# Patient Record
Sex: Female | Born: 1970 | Race: White | Hispanic: No | Marital: Single | Smoking: Never smoker
Health system: Southern US, Community
[De-identification: ages and names within clinical notes are randomized; demographics above are authoritative.]

## PROBLEM LIST (undated history)

## (undated) DIAGNOSIS — M3500 Sicca syndrome, unspecified: Secondary | ICD-10-CM

## (undated) HISTORY — PX: TONSILLECTOMY: SUR1361

## (undated) HISTORY — PX: MYRINGOTOMY: SUR874

---

## 2009-09-29 ENCOUNTER — Ambulatory Visit: Payer: Self-pay | Admitting: Internal Medicine

## 2011-06-19 ENCOUNTER — Ambulatory Visit: Payer: Self-pay

## 2011-08-20 ENCOUNTER — Ambulatory Visit: Payer: Self-pay

## 2012-05-15 ENCOUNTER — Ambulatory Visit: Payer: Self-pay

## 2013-05-22 ENCOUNTER — Ambulatory Visit: Payer: Self-pay | Admitting: Emergency Medicine

## 2013-05-22 LAB — CBC WITH DIFFERENTIAL/PLATELET
Basophil #: 0 10*3/uL (ref 0.0–0.1)
Basophil %: 0.3 %
Eosinophil #: 0 10*3/uL (ref 0.0–0.7)
Eosinophil %: 1 %
HCT: 43.2 % (ref 35.0–47.0)
HGB: 14.6 g/dL (ref 12.0–16.0)
Lymphocyte #: 0.8 10*3/uL — ABNORMAL LOW (ref 1.0–3.6)
Lymphocyte %: 16.6 %
MCH: 29.3 pg (ref 26.0–34.0)
MCHC: 33.7 g/dL (ref 32.0–36.0)
MCV: 87 fL (ref 80–100)
Monocyte #: 0.4 x10 3/mm (ref 0.2–0.9)
Monocyte %: 7.6 %
Neutrophil #: 3.6 10*3/uL (ref 1.4–6.5)
Neutrophil %: 74.5 %
Platelet: 156 10*3/uL (ref 150–440)
RBC: 4.98 10*6/uL (ref 3.80–5.20)
RDW: 13.2 % (ref 11.5–14.5)
WBC: 4.8 10*3/uL (ref 3.6–11.0)

## 2013-05-22 LAB — COMPREHENSIVE METABOLIC PANEL
Albumin: 4.2 g/dL (ref 3.4–5.0)
Alkaline Phosphatase: 57 U/L (ref 50–136)
Anion Gap: 7 (ref 7–16)
BUN: 13 mg/dL (ref 7–18)
Bilirubin,Total: 0.5 mg/dL (ref 0.2–1.0)
Calcium, Total: 9 mg/dL (ref 8.5–10.1)
Chloride: 104 mmol/L (ref 98–107)
Co2: 27 mmol/L (ref 21–32)
Creatinine: 0.73 mg/dL (ref 0.60–1.30)
EGFR (African American): >60
EGFR (Non-African Amer.): >60
Glucose: 98 mg/dL (ref 65–99)
Osmolality: 276 (ref 275–301)
Potassium: 4.4 mmol/L (ref 3.5–5.1)
SGOT(AST): 17 U/L (ref 15–37)
SGPT (ALT): 22 U/L (ref 12–78)
Sodium: 138 mmol/L (ref 136–145)
Total Protein: 8.1 g/dL (ref 6.4–8.2)

## 2013-05-22 LAB — SEDIMENTATION RATE: Erythrocyte Sed Rate: 14 mm/hr (ref 0–20)

## 2013-11-08 IMAGING — CR DG CHEST 2V
1 series · 2 of 2 positions shown · non-contrast
Comparison: none

REASON FOR EXAM: Wheezing; Hx of Sjogrens Syndrome
COMMENTS:   LMP: Now

[Series 1: pa · 0.17mm/px · 2 of 2 slices shown]
[im 1/2]
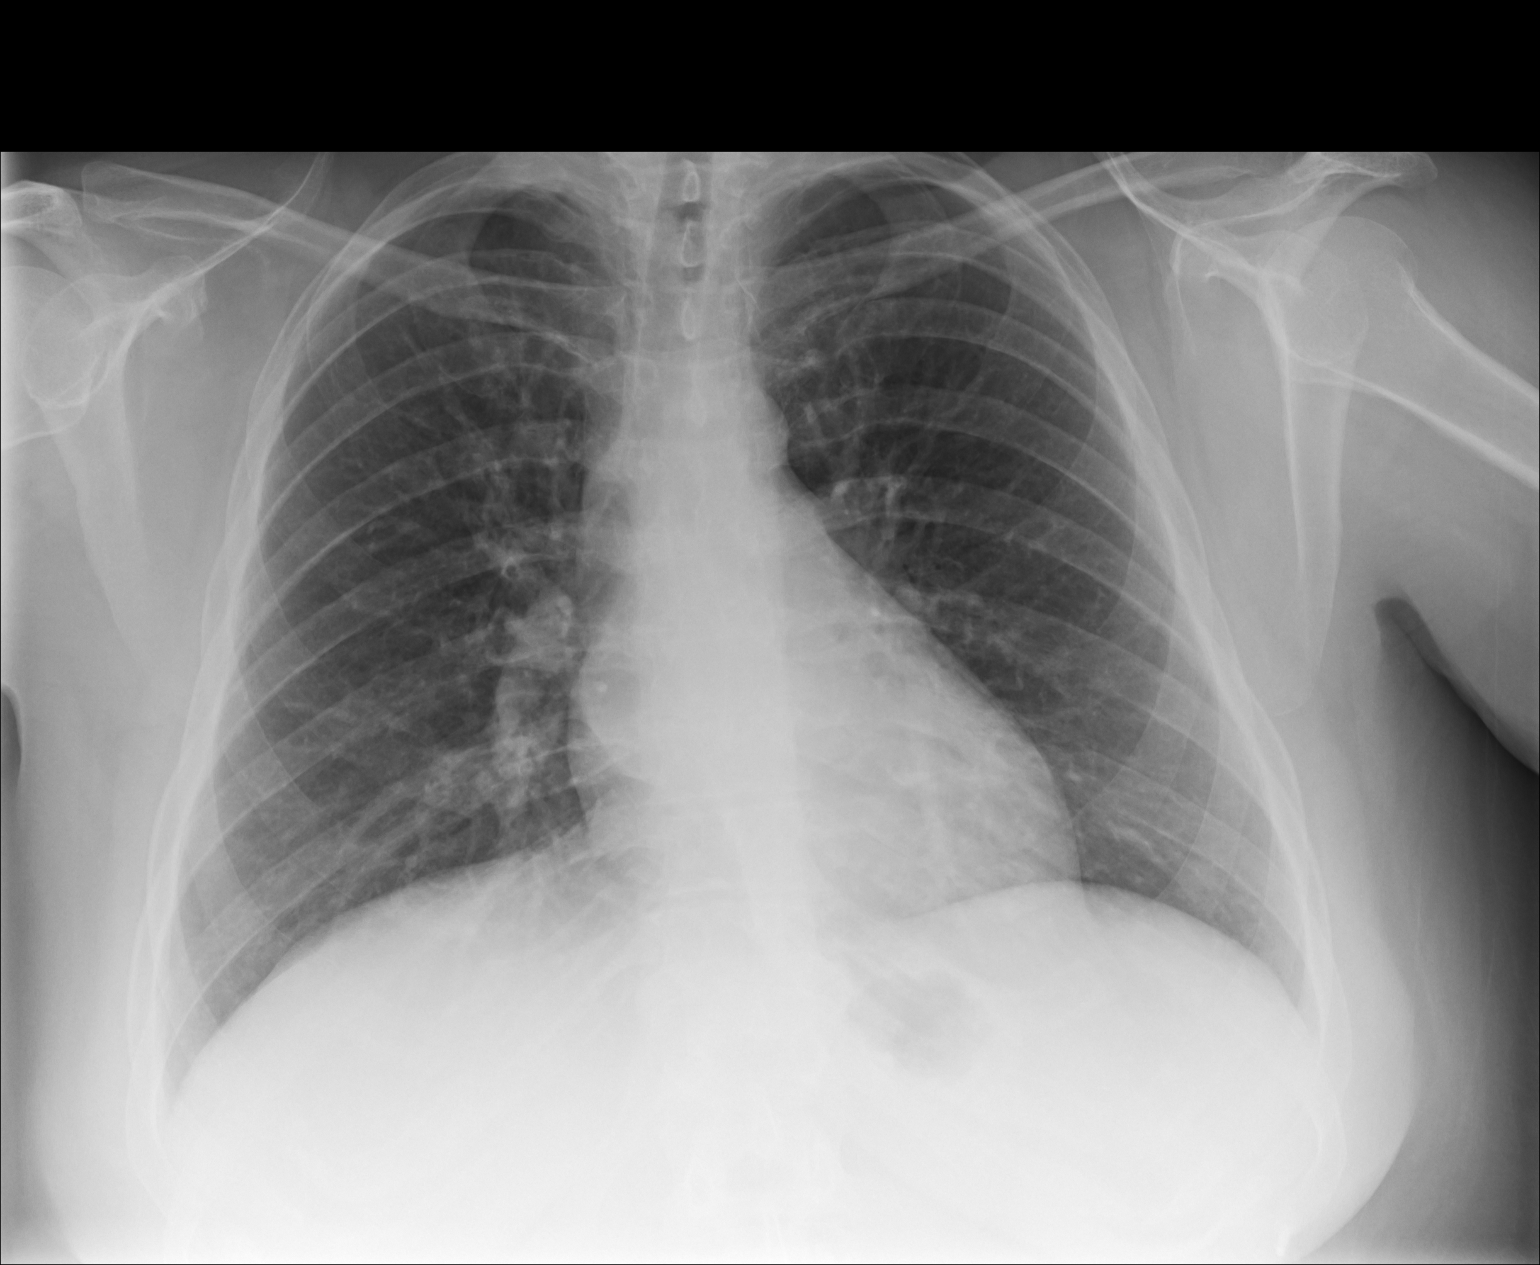
[im 2/2]
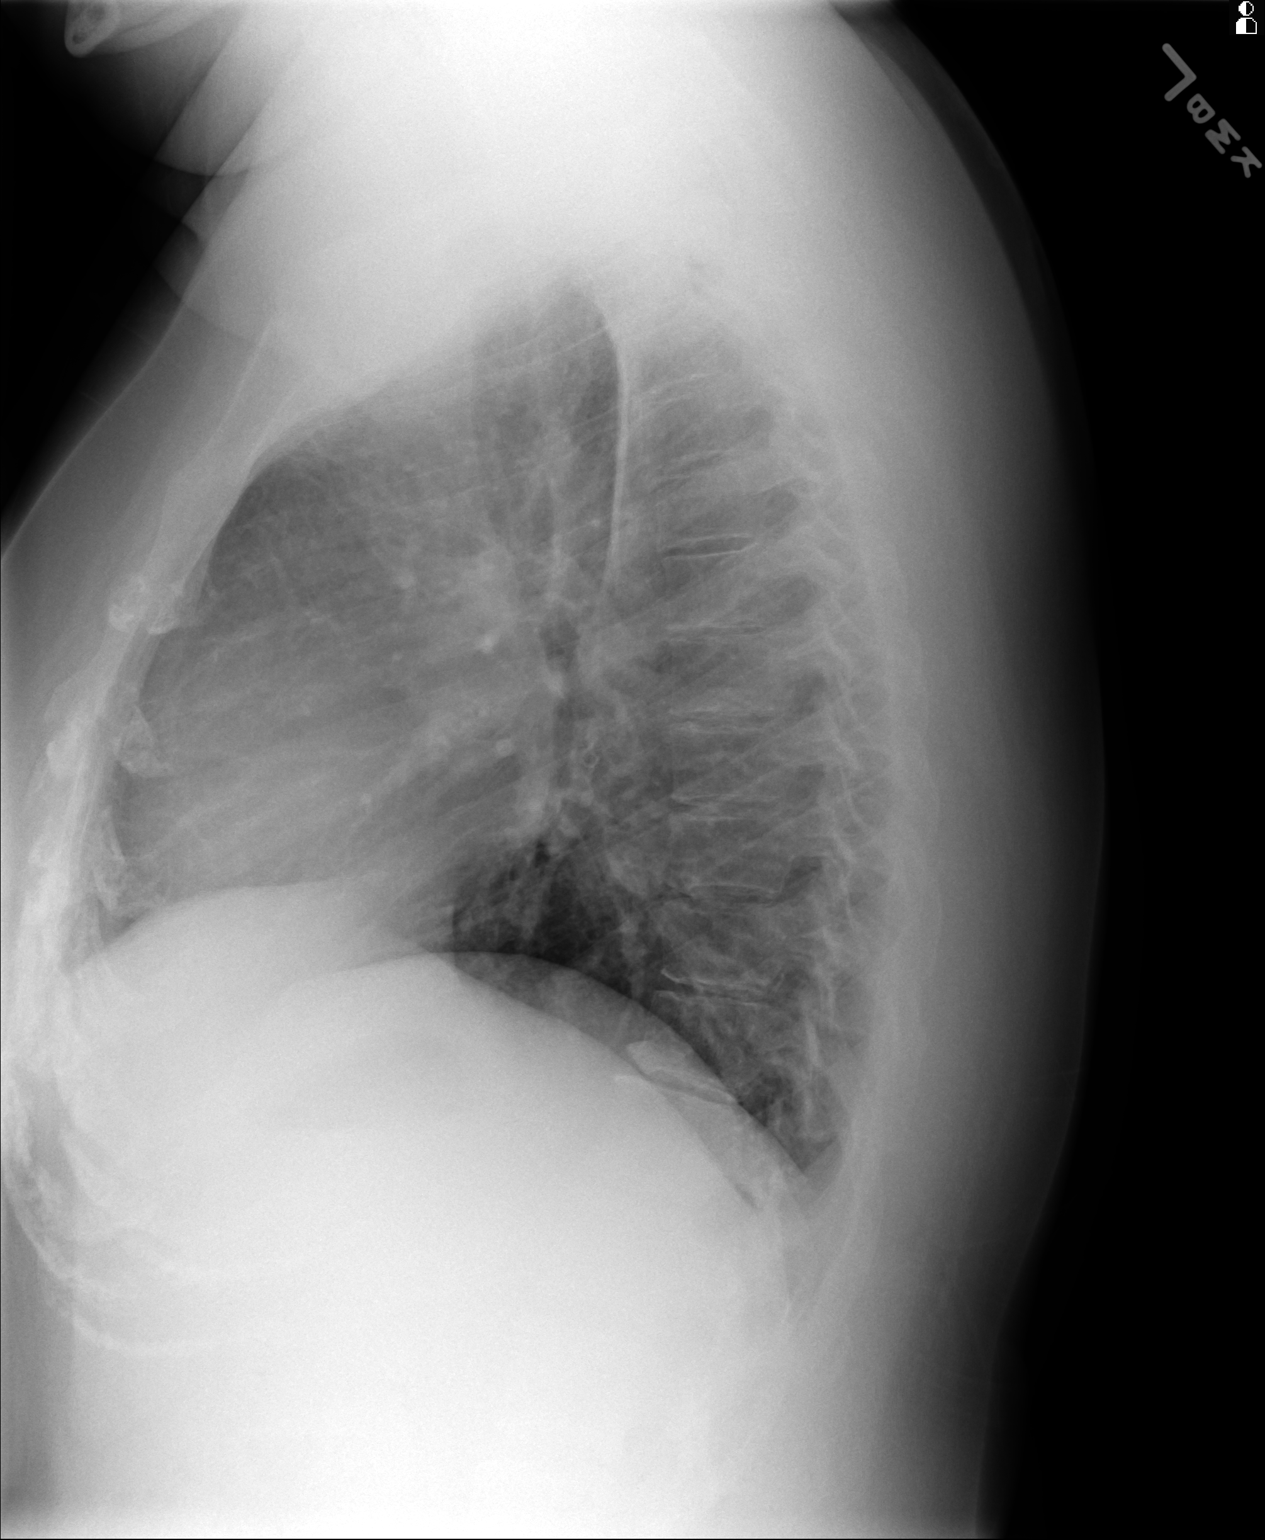

[2 of 2 positions shown; findings below may reference images not displayed]

PROCEDURE:     MDR - MDR CHEST PA(OR AP) AND LATERAL  - May 15, 2012 [DATE]

RESULT:     Comparison is made to the study 19 June, 2011.

The lungs are clear. The heart and pulmonary vessels are normal. The bony
and mediastinal structures are unremarkable. There is no effusion. There is
no pneumothorax or evidence of congestive failure.
IMPRESSION: No acute cardiopulmonary disease.

[REDACTED]

## 2015-05-21 ENCOUNTER — Ambulatory Visit
Admission: EM | Admit: 2015-05-21 | Discharge: 2015-05-21 | Disposition: A | Discharge status: Home / Self Care | Payer: 59 | Attending: Family Medicine | Admitting: Family Medicine

## 2015-05-21 DIAGNOSIS — L237 Allergic contact dermatitis due to plants, except food: Secondary | ICD-10-CM

## 2015-05-21 HISTORY — DX: Sjogren syndrome, unspecified: M35.00

## 2015-05-21 MED ORDER — PREDNISONE 10 MG (21) PO TBPK: 10.0000 mg | ORAL_TABLET | Freq: Every day | ORAL | Status: AC

## 2015-05-21 MED ORDER — TRIAMCINOLONE ACETONIDE 0.1 % EX CREA: 1.0000 "application " | TOPICAL_CREAM | Freq: Two times a day (BID) | CUTANEOUS | Status: AC

## 2015-05-21 NOTE — Discharge Instructions (Signed)
Poison Oak Poison oak is an inflammation of the skin (contact dermatitis). It is caused by contact with the allergens on the leaves of the oak (toxicodendron) plants. Depending on your sensitivity, the rash may consist simply of redness and itching, or it may also progress to blisters which may break open (rupture). These must be well cared for to prevent secondary germ (bacterial) infection as these infections can lead to scarring. The eyes may also get puffy. The puffiness is worst in the morning and gets better as the day progresses. Healing is best accomplished by keeping any open areas dry, clean, covered with a bandage, and covered with an antibacterial ointment if needed. Without secondary infection, this dermatitis usually heals without scarring within 2 to 3 weeks without treatment. HOME CARE INSTRUCTIONS When you have been exposed to poison oak, it is very important to thoroughly wash with soap and water as soon as the exposure has been discovered. You have about one half hour to remove the plant resin before it will cause the rash. This cleaning will quickly destroy the oil or antigen on the skin (the antigen is what causes the rash). Wash aggressively under the fingernails as any plant resin still there will continue to spread the rash. Do not rub skin vigorously when washing affected area. Poison oak cannot spread if no oil from the plant remains on your body. Rash that has progressed to weeping sores (lesions) will not spread the rash unless you have not washed thoroughly. It is also important to clean any clothes you have been wearing as they may carry active allergens which will spread the rash, even several days later. Avoidance of the plant in the future is the best measure. Poison oak plants can be recognized by the number of leaves. Generally, poison oak has three leaves with flowering branches on a single stem. Diphenhydramine may be purchased over the counter and used as needed for  itching. Do not drive with this medication if it makes you drowsy. Ask your caregiver about medication for children. SEEK IMMEDIATE MEDICAL CARE IF:   Open areas of the rash develop.  You notice redness extending beyond the area of the rash.  There is a pus like discharge.  There is increased pain.  Other signs of infection develop (such as fever). Document Released: 02/20/2003 Document Revised: 11/08/2011 Document Reviewed: 07/02/2009 [employer] Patient Information 2015 [employer]. This information is not intended to replace advice given to you by your health care provider. Make sure you discuss any questions you have with your health care provider.  

## 2015-05-21 NOTE — ED Notes (Signed)
Pt states "I have poison oak for a week or so, I thought I had it under control and then more is around my neck."

## 2015-05-21 NOTE — ED Provider Notes (Signed)
CSN: [id number]     Arrival date & time 05/21/15  1243 History   First MD Initiated Contact with Patient 05/21/15 1423     Chief Complaint  Patient presents with  . Rash   (Consider location/radiation/quality/duration/timing/severity/associated sxs/prior Treatment) HPI   This a 44 year old female who presents with one-week history of poison ivy she's been treating over-the-counter medications but to no avail. It actually started to spread further. His one week ago she was painting the backside of her fence along a blood line must of contacted the plan at that time. The rash is very itchy. Started on her neck and that appears where it seems to be the most severe.  Past Medical History  Diagnosis Date  . Sjoegren syndrome    Past Surgical History  Procedure Laterality Date  . Tonsillectomy    . Myringotomy     Family History  Problem Relation Age of Onset  . Diabetes Mother   . Diabetes Father   . Coronary artery disease Father    Social History  Substance Use Topics  . Smoking status: Never Smoker   . Smokeless tobacco: None  . Alcohol Use: Yes   OB History    No data available     Review of Systems  Constitutional: Negative for fever, chills and fatigue.  Skin: Positive for color change and rash.  All other systems reviewed and are negative.   Allergies  Review of patient's allergies indicates no known allergies.  Home Medications   Prior to Admission medications   Medication Sig Start Date End Date Taking? Authorizing Provider  predniSONE (STERAPRED UNI-PAK 21 TAB) 10 MG (21) TBPK tablet Take 1 tablet (10 mg total) by mouth daily. Take 6 tabs by mouth daily  for 2 days, then 5 tabs for 2 days, then 4 tabs for 2 days, then 3 tabs for 2 days, 2 tabs for 2 days, then 1 tab by mouth daily for 2 days 05/21/15   Lutricia Feil, PA-C  triamcinolone cream (KENALOG) 0.1 % Apply 1 application topically 2 (two) times daily. 05/21/15   Lutricia Feil, PA-C   Meds Ordered  and Administered this Visit  Medications - No data to display  BP 131/88 mmHg  Pulse 78  Temp(Src) 97.2 F (36.2 C) (Tympanic)  Resp 16  Ht  (1.702 m)  Wt 208 lb (94.348 kg)  BMI 32.57 kg/m2  SpO2 100%  LMP 05/14/2015 No data found.   Physical Exam  Constitutional: She is oriented to person, place, and time. She appears well-developed and well-nourished. No distress.  HENT:  Head: Normocephalic and atraumatic.  Neurological: She is alert and oriented to person, place, and time.  Skin: She is not diaphoretic.  Examination of the skin shows a erythematous based vesicular rash linear and places worse on the neck under her chin and several patches on her upper arms popliteal fossa and on her trunk. There are some  excoriations present.  Psychiatric: She has a normal mood and affect. Her behavior is normal. Judgment and thought content normal.  Nursing note and vitals reviewed.   ED Course  Procedures (including critical care time)  Labs Review Labs Reviewed - No data to display  Imaging Review No results found.   Visual Acuity Review  Right Eye Distance:   Left Eye Distance:   Bilateral Distance:    Right Eye Near:   Left Eye Near:    Bilateral Near:         MDM  1. Poison ivy dermatitis    New Prescriptions   PREDNISONE (STERAPRED UNI-PAK 21 TAB) 10 MG (21) TBPK TABLET    Take 1 tablet (10 mg total) by mouth daily. Take 6 tabs by mouth daily  for 2 days, then 5 tabs for 2 days, then 4 tabs for 2 days, then 3 tabs for 2 days, 2 tabs for 2 days, then 1 tab by mouth daily for 2 days   TRIAMCINOLONE CREAM (KENALOG) 0.1 %    Apply 1 application topically 2 (two) times daily.   Plan: 1. Diagnosis reviewed with patient 2. rx as per orders; risks, benefits, potential side effects reviewed with patient 3. Recommend supportive treatment with benadryl 4. F/u prn if symptoms worsen or don't improve     Lutricia Feil, PA-C 05/21/15 1445
